# Patient Record
Sex: Male | Born: 1985 | Race: White | Hispanic: No | Marital: Married | Smoking: Current every day smoker
Health system: Southern US, Community
[De-identification: ages and names within clinical notes are randomized; demographics above are authoritative.]

## PROBLEM LIST (undated history)

## (undated) DIAGNOSIS — E669 Obesity, unspecified: Secondary | ICD-10-CM

## (undated) HISTORY — PX: NO PAST SURGERIES: SHX2092

---
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
## 2004-02-01 ENCOUNTER — Emergency Department (HOSPITAL_COMMUNITY): Admission: EM | Admit: 2004-02-01 | Discharge: 2004-02-02 | Payer: Self-pay | Admitting: Emergency Medicine

## 2013-06-13 ENCOUNTER — Inpatient Hospital Stay (HOSPITAL_COMMUNITY)
Admission: EM | Admit: 2013-06-13 | Discharge: 2013-06-16 | DRG: 864 | Disposition: A | Discharge status: Home / Self Care | Payer: Managed Care, Other (non HMO) | Attending: Internal Medicine | Admitting: Internal Medicine

## 2013-06-13 ENCOUNTER — Emergency Department (HOSPITAL_COMMUNITY): Payer: Managed Care, Other (non HMO)

## 2013-06-13 ENCOUNTER — Encounter (HOSPITAL_COMMUNITY): Payer: Self-pay | Admitting: Emergency Medicine

## 2013-06-13 ENCOUNTER — Emergency Department (HOSPITAL_COMMUNITY)
Admission: EM | Admit: 2013-06-13 | Discharge: 2013-06-13 | Disposition: A | Discharge status: Home / Self Care | Payer: Managed Care, Other (non HMO) | Attending: Emergency Medicine | Admitting: Emergency Medicine

## 2013-06-13 DIAGNOSIS — R509 Fever, unspecified: Principal | ICD-10-CM | POA: Diagnosis present

## 2013-06-13 DIAGNOSIS — E669 Obesity, unspecified: Secondary | ICD-10-CM

## 2013-06-13 DIAGNOSIS — R000 Tachycardia, unspecified: Secondary | ICD-10-CM | POA: Diagnosis present

## 2013-06-13 DIAGNOSIS — R21 Rash and other nonspecific skin eruption: Secondary | ICD-10-CM | POA: Insufficient documentation

## 2013-06-13 DIAGNOSIS — B2790 Infectious mononucleosis, unspecified without complication: Secondary | ICD-10-CM | POA: Diagnosis present

## 2013-06-13 DIAGNOSIS — R600 Localized edema: Secondary | ICD-10-CM | POA: Diagnosis present

## 2013-06-13 DIAGNOSIS — M7989 Other specified soft tissue disorders: Secondary | ICD-10-CM | POA: Diagnosis present

## 2013-06-13 DIAGNOSIS — E871 Hypo-osmolality and hyponatremia: Secondary | ICD-10-CM | POA: Diagnosis present

## 2013-06-13 DIAGNOSIS — F17200 Nicotine dependence, unspecified, uncomplicated: Secondary | ICD-10-CM | POA: Diagnosis not present

## 2013-06-13 DIAGNOSIS — Z833 Family history of diabetes mellitus: Secondary | ICD-10-CM

## 2013-06-13 DIAGNOSIS — R609 Edema, unspecified: Secondary | ICD-10-CM | POA: Diagnosis present

## 2013-06-13 DIAGNOSIS — Z6841 Body Mass Index (BMI) 40.0 and over, adult: Secondary | ICD-10-CM

## 2013-06-13 DIAGNOSIS — Z823 Family history of stroke: Secondary | ICD-10-CM

## 2013-06-13 HISTORY — DX: Obesity, unspecified: E66.9

## 2013-06-13 LAB — CBC WITH DIFFERENTIAL/PLATELET
Basophils Absolute: 0 10*3/uL (ref 0.0–0.1)
Basophils Relative: 0 % (ref 0–1)
Basophils Relative: 0 % (ref 0–1)
Eosinophils Relative: 1 % (ref 0–5)
HCT: 42.3 % (ref 39.0–52.0)
HCT: 40.6 % (ref 39.0–52.0)
Hemoglobin: 14.9 g/dL (ref 13.0–17.0)
Lymphocytes Relative: 12 % (ref 12–46)
Lymphocytes Relative: 11 % — ABNORMAL LOW (ref 12–46)
Lymphs Abs: 0.7 10*3/uL (ref 0.7–4.0)
Lymphs Abs: 0.7 10*3/uL (ref 0.7–4.0)
MCHC: 35.2 g/dL (ref 30.0–36.0)
MCHC: 36 g/dL (ref 30.0–36.0)
MCV: 84.9 fL (ref 78.0–100.0)
Monocytes Absolute: 0.6 10*3/uL (ref 0.1–1.0)
Monocytes Absolute: 0.9 10*3/uL (ref 0.1–1.0)
Monocytes Relative: 10 % (ref 3–12)
Neutro Abs: 4.4 10*3/uL (ref 1.7–7.7)
Neutrophils Relative %: 78 % — ABNORMAL HIGH (ref 43–77)
Neutrophils Relative %: 71 % (ref 43–77)
Platelets: 168 10*3/uL (ref 150–400)
Platelets: 162 10*3/uL (ref 150–400)
RBC: 4.97 MIL/uL (ref 4.22–5.81)
RDW: 12.1 % (ref 11.5–15.5)
WBC: 5.7 10*3/uL (ref 4.0–10.5)
WBC: 5.7 10*3/uL (ref 4.0–10.5)

## 2013-06-13 LAB — COMPREHENSIVE METABOLIC PANEL
ALT: 31 U/L (ref 0–53)
AST: 30 U/L (ref 0–37)
Albumin: 3.2 g/dL — ABNORMAL LOW (ref 3.5–5.2)
CO2: 28 mEq/L (ref 19–32)
Calcium: 8.6 mg/dL (ref 8.4–10.5)
Chloride: 99 mEq/L (ref 96–112)
Creatinine, Ser: 0.86 mg/dL (ref 0.50–1.35)
GFR calc non Af Amer: >90 mL/min (ref >90)
Glucose, Bld: 136 mg/dL — ABNORMAL HIGH (ref 70–99)
Sodium: 135 mEq/L (ref 135–145)
Total Protein: 6.6 g/dL (ref 6.0–8.3)

## 2013-06-13 LAB — HEPATIC FUNCTION PANEL
ALT: 34 U/L (ref 0–53)
Alkaline Phosphatase: 68 U/L (ref 39–117)
Indirect Bilirubin: 0.4 mg/dL (ref 0.3–0.9)
Total Bilirubin: 0.5 mg/dL (ref 0.3–1.2)
Total Protein: 7 g/dL (ref 6.0–8.3)

## 2013-06-13 LAB — BASIC METABOLIC PANEL
BUN: 12 mg/dL (ref 6–23)
CO2: 25 mEq/L (ref 19–32)
Chloride: 95 mEq/L — ABNORMAL LOW (ref 96–112)
GFR calc Af Amer: >90 mL/min (ref >90)
Glucose, Bld: 116 mg/dL — ABNORMAL HIGH (ref 70–99)
Potassium: 3.4 mEq/L — ABNORMAL LOW (ref 3.5–5.1)
Sodium: 131 mEq/L — ABNORMAL LOW (ref 135–145)

## 2013-06-13 LAB — SEDIMENTATION RATE: Sed Rate: 45 mm/hr — ABNORMAL HIGH (ref 0–16)

## 2013-06-13 LAB — D-DIMER, QUANTITATIVE (NOT AT ARMC): D-Dimer, Quant: 2.79 ug/mL-FEU — ABNORMAL HIGH (ref 0.00–0.48)

## 2013-06-13 MED ORDER — HYDROCODONE-ACETAMINOPHEN 5-325 MG PO TABS: ORAL_TABLET | ORAL | Status: DC

## 2013-06-13 MED ORDER — HYDROMORPHONE HCL PF 1 MG/ML IJ SOLN
0.5000 mg | INTRAMUSCULAR | Status: DC | PRN
  Administered 2013-06-14: 0.5 mg via INTRAVENOUS
  Filled 2013-06-13: qty 1

## 2013-06-13 MED ORDER — DOXYCYCLINE HYCLATE 100 MG PO TABS
100.0000 mg | ORAL_TABLET | Freq: Two times a day (BID) | ORAL | Status: DC
  Administered 2013-06-13 – 2013-06-16 (×6): 100 mg via ORAL
  Filled 2013-06-13 (×7): qty 1

## 2013-06-13 MED ORDER — ACETAMINOPHEN 650 MG RE SUPP: 650.0000 mg | Freq: Four times a day (QID) | RECTAL | Status: DC | PRN

## 2013-06-13 MED ORDER — IBUPROFEN 800 MG PO TABS
800.0000 mg | ORAL_TABLET | Freq: Once | ORAL | Status: AC
  Administered 2013-06-13: 800 mg via ORAL
  Filled 2013-06-13: qty 1

## 2013-06-13 MED ORDER — ACETAMINOPHEN 325 MG PO TABS: 650.0000 mg | ORAL_TABLET | Freq: Four times a day (QID) | ORAL | Status: DC | PRN

## 2013-06-13 MED ORDER — DOXYCYCLINE HYCLATE 100 MG PO TABS
100.0000 mg | ORAL_TABLET | Freq: Once | ORAL | Status: AC
  Administered 2013-06-13: 100 mg via ORAL
  Filled 2013-06-13: qty 1

## 2013-06-13 MED ORDER — ONDANSETRON HCL 4 MG PO TABS
4.0000 mg | ORAL_TABLET | Freq: Once | ORAL | Status: AC
  Administered 2013-06-13: 4 mg via ORAL
  Filled 2013-06-13: qty 1

## 2013-06-13 MED ORDER — ENOXAPARIN SODIUM 80 MG/0.8ML ~~LOC~~ SOLN
70.0000 mg | Freq: Every day | SUBCUTANEOUS | Status: DC
  Administered 2013-06-13 – 2013-06-15 (×3): 70 mg via SUBCUTANEOUS
  Filled 2013-06-13 (×4): qty 0.8

## 2013-06-13 MED ORDER — ONDANSETRON HCL 4 MG PO TABS: 4.0000 mg | ORAL_TABLET | Freq: Four times a day (QID) | ORAL | Status: DC | PRN

## 2013-06-13 MED ORDER — SODIUM CHLORIDE 0.9 % IV SOLN
INTRAVENOUS | Status: DC
  Administered 2013-06-13 – 2013-06-15 (×4): via INTRAVENOUS

## 2013-06-13 MED ORDER — SODIUM CHLORIDE 0.9 % IV SOLN
1000.0000 mL | INTRAVENOUS | Status: DC
  Administered 2013-06-13: 1000 mL via INTRAVENOUS

## 2013-06-13 MED ORDER — ALUM & MAG HYDROXIDE-SIMETH 200-200-20 MG/5ML PO SUSP: 30.0000 mL | Freq: Four times a day (QID) | ORAL | Status: DC | PRN

## 2013-06-13 MED ORDER — ONDANSETRON HCL 4 MG/2ML IJ SOLN: 4.0000 mg | Freq: Four times a day (QID) | INTRAMUSCULAR | Status: DC | PRN

## 2013-06-13 MED ORDER — SODIUM CHLORIDE 0.9 % IV BOLUS (SEPSIS)
1000.0000 mL | Freq: Once | INTRAVENOUS | Status: AC
  Administered 2013-06-13: 1000 mL via INTRAVENOUS

## 2013-06-13 MED ORDER — HYDROCODONE-ACETAMINOPHEN 5-325 MG PO TABS
2.0000 | ORAL_TABLET | Freq: Once | ORAL | Status: AC
  Administered 2013-06-13: 2 via ORAL
  Filled 2013-06-13: qty 2

## 2013-06-13 MED ORDER — SODIUM CHLORIDE 0.9 % IV SOLN: INTRAVENOUS | Status: AC

## 2013-06-13 MED ORDER — DOXYCYCLINE HYCLATE 100 MG PO CAPS: 100.0000 mg | ORAL_CAPSULE | Freq: Two times a day (BID) | ORAL | Status: DC

## 2013-06-13 MED ORDER — OXYCODONE HCL 5 MG PO TABS: 5.0000 mg | ORAL_TABLET | ORAL | Status: DC | PRN

## 2013-06-13 MED ORDER — ZOLPIDEM TARTRATE 5 MG PO TABS
5.0000 mg | ORAL_TABLET | Freq: Every evening | ORAL | Status: DC | PRN
  Administered 2013-06-13: 5 mg via ORAL
  Filled 2013-06-13: qty 1

## 2013-06-13 MED ORDER — SODIUM CHLORIDE 0.9 % IV SOLN
1000.0000 mL | Freq: Once | INTRAVENOUS | Status: AC
  Administered 2013-06-13: 1000 mL via INTRAVENOUS

## 2013-06-13 NOTE — H&P (Signed)
[facility] History and Physical  Craig Stout ZOX:[id number] DOB: 10-25-1985 DOA: 06/13/2013  Referring physician:  EDP PCP: No PCP Per Patient  Specialists:   Chief Complaint:   Fever and Worsening Rash  HPI: Craig Stout is a 27 y.o. male who presents to the [facility] ED with complaints of Fevers and Worsening of a Rash X 2 days and Edema of both of his legs for the past 3 days.    The Rash erupted on his lower legs and has progressed up to his ABD.   The rash is non-pruritic, and painless.   He was seen at the [facility] ED in the AM and was evaluated  And had blood cultures done, and an RPR, and Lyme disease titer were sent, and he was placed empirically on doxycycline.   He came to [facility] ED later because the rash continued to spread.     Of note, he works as a Designer, jewellery distances, and he has a sick 68 year old child at home with a URI Illness.   He does report having some URI sxs such as cough and congestion.   He also has a dog at home, and does not recall having any tick bites.     Review of Systems: The patient denies anorexia, headaches, weight loss, vision loss, diplopia, dizziness, decreased hearing, rhinitis, hoarseness, chest pain, syncope, dyspnea on exertion, balance deficits, cough, hemoptysis, abdominal pain, nausea, vomiting, diarrhea, constipation, hematemesis, melena, hematochezia, severe indigestion/heartburn, dysuria, hematuria, incontinence, muscle weakness, suspicious skin lesions, transient blindness, difficulty walking, depression, unusual weight change, abnormal bleeding, enlarged lymph nodes, angioedema, and breast masses.    Past Medical History  Diagnosis Date  . Obesity     Past Surgical History  Procedure Laterality Date  . No past surgeries      Prior to Admission medications   Medication Sig Start Date End Date Taking? Authorizing Provider  doxycycline (VIBRAMYCIN) 100 MG capsule Take 1 capsule (100 mg total) by mouth  2 (two) times daily. 06/13/13 06/23/13 Yes Kathie Dike, PA-C  HYDROcodone-acetaminophen (NORCO/VICODIN) 5-325 MG per tablet Take 1-2 tablets by mouth every 6 (six) hours as needed for moderate pain.   Yes Historical Provider, MD  ibuprofen (ADVIL,MOTRIN) 200 MG tablet Take 600 mg by mouth every 6 (six) hours as needed for headache or moderate pain.   Yes Historical Provider, MD  MELATONIN PO Take 2 tablets by mouth as needed (sleep).   Yes Historical Provider, MD    No Known Allergies  Social History:  reports that he has been smoking Cigarettes.  He has been smoking about 0.00 packs per day. He does not have any smokeless tobacco history on file. He reports that he drinks alcohol. His drug history is not on file.     Family History  Problem Relation Age of Onset  . Stroke Maternal Grandmother   . Diabetes Father       Physical Exam:  GEN:  Pleasant  MorbidlyObese 27 y.o. Caucasian male examined  and in no acute distress; cooperative with exam Filed Vitals:   06/13/13 1813 06/13/13 1819 06/13/13 2045  BP: 122/62 115/55 112/58  Pulse: 89 83 80  Temp: 97.8 F (36.6 C) 97.4 F (36.3 C)   TempSrc: Oral Oral   Resp: 16 18   SpO2: 98% 95% 99%   Blood pressure 112/58, pulse 80, temperature 97.4 F (36.3 C), temperature source Oral, resp. rate 18, SpO2 99.00%. PSYCH: He is  alert and oriented x4; does not appear anxious does not appear depressed; affect is normal HEENT: Normocephalic and Atraumatic, Mucous membranes pink; Mild Conjuctival Injection; PERRLA; EOM intact; Fundi:  Benign;  No scleral icterus, Nares: Patent, Oropharynx: Clear, Fair Dentition, Neck:  FROM, no cervical lymphadenopathy nor thyromegaly or carotid bruit; no JVD; Breasts:: Not examined CHEST WALL: No tenderness CHEST: Normal respiration, clear to auscultation bilaterally HEART: Regular rate and rhythm; no murmurs rubs or gallops BACK: No kyphosis or scoliosis; no CVA tenderness ABDOMEN: Positive Bowel Sounds,  Obese, soft non-tender; no masses, no organomegaly, no pannus; no intertriginous candida. Rectal Exam: Not done EXTREMITIES: No cyanosis, clubbing or edema; no ulcerations. Genitalia: not examined PULSES: 2+ and symmetric SKIN: Normal hydration;   +  Maculopapular Lesions on BLEs with progression to the lower Abdominal Area, Non-blanching, without exudate or ulceration CNS: Cranial nerves 2-12 grossly intact no focal neurologic deficit    Labs on Admission:  Basic Metabolic Panel:  Recent Labs Lab 06/13/13 1232 06/13/13 1907  NA 131* 135  K 3.4* 3.8  CL 95* 99  CO2 25 28  GLUCOSE 116* 136*  BUN 12 14  CREATININE 0.88 0.86  CALCIUM 9.0 8.6   Liver Function Tests:  Recent Labs Lab 06/13/13 1232 06/13/13 1907  AST 34 30  ALT 34 31  ALKPHOS 68 66  BILITOT 0.5 0.5  PROT 7.0 6.6  ALBUMIN 3.5 3.2*   No results found for this basename: LIPASE, AMYLASE,  in the last 168 hours No results found for this basename: AMMONIA,  in the last 168 hours CBC:  Recent Labs Lab 06/13/13 1232 06/13/13 1907  WBC 5.7 5.7  NEUTROABS 4.4 4.1  HGB 14.9 14.6  HCT 42.3 40.6  MCV 85.1 84.9  PLT 168 162   Cardiac Enzymes: No results found for this basename: CKTOTAL, CKMB, CKMBINDEX, TROPONINI,  in the last 168 hours  BNP (last 3 results) No results found for this basename: PROBNP,  in the last 8760 hours CBG: No results found for this basename: GLUCAP,  in the last 168 hours  Radiological Exams on Admission: Dg Chest Port 1 View  06/13/2013   CLINICAL DATA:  Chest pain.  EXAM: PORTABLE CHEST - 1 VIEW  COMPARISON:  None.  FINDINGS: The heart size and mediastinal contours are within normal limits. Both lungs are clear. The visualized skeletal structures are unremarkable.  IMPRESSION: No active disease.   Electronically Signed   By: Elige Ko   On: 06/13/2013 12:28        Assessment/Plan Principal Problem:   Febrile illness Active Problems:   Cutaneous eruption   Edema of  both legs   Obesity   Tobacco use disorder    1.  Febrile Illness-  Most Likely viral,  Blood Cultures had been sent, and are pending.  Differential Dx includes:  Viral Exanthem, such as Parvo, Bacterial Endocarditis less likely, or a Rheumatological Exanthem such as Still's disease.  Monitor PLTs and WBCs,     2.  Cutaneous Eruption- progressing,  See Diff Dx in #1.  Monitor, most likely Self Limited.    3.  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     4.   Obesity-  Needs Weight loss and increase in Activity.     5.   Tobacco Use Disorder-  Cessation discussed, Nicotine patch while Inpt.    6.  DVT prophylaxis  with Lovenox, but monitor PLTs.          Code Status:   FULL CODE Family Communication:    Family at bedside Disposition Plan:     Observation     Time spent: 37 Minutes  Ron Parker [facility] Pager [phone]  If 7PM-7AM, please contact night-coverage [email or web address] Password [facility] 06/13/2013, 9:21 PM

## 2013-06-13 NOTE — ED Notes (Signed)
He states he has a gradually expanding rash which began at lat. Left ankle this Tues.  Seen at [facility] this morning; and was rx with Doxycycline.  He is concerned that rash which was this morning limited to bilat. Lower extremities now involves lower abd.  He also c/o swelling of bilat. Lower legs/ankles.  He currently has 2-3+ pitting edema of bilat. ankles/feet.

## 2013-06-13 NOTE — Progress Notes (Signed)
PHARMACY - LOVENOX  Lovenox 40mg  sq q24h ordered for VTE prophylaxis in this 27 yr male.  Pharmacy asked to adjust dose as needed.  TBW = 136.1kg with BMI = 40 and CrCl > 120 ml/min.  As BMI > 30, will adjust Lovenox dose to 0.5 mg/kg/q24h  PLAN:  Change Lovenox to 70mg  sq q24h  Thank you, Terrilee Files, PharmD

## 2013-06-13 NOTE — ED Provider Notes (Signed)
Medical screening examination/treatment/procedure(s) were conducted as a shared visit with non-physician practitioner(s) and myself.  I personally evaluated the patient during the encounter.  EKG Interpretation   None       Patient is a 27 y.o. male with no significant past medical history who has had redness, swelling, pain and a rash to his bilateral lower extremities as well as fever that started 3 days ago. On exam, patient was initially febrile and tachycardic but this has resolved with treatment of his fever. He's otherwise well-appearing, nontoxic, well-hydrated. Denies any headache or neck pain next is Ms. No meningismus on exam. Neurologically intact. Patient does have a erythematous, papular rash to his bilateral lower extremities it is worse at his medial thighs. Very mild swelling of his lower extremities bilaterally. Normal sensation and 2+ DP pulses bilaterally. No drainage from any of these lesions. No induration. Very minimal petechiae seen on the anterior right distal shin. Suspect folliculitis versus possible tick borne illness. Given patient is well-appearing, I feel he is safe for discharge home. Have offered admission however the patient would like discharge. His labs do show a mild hyponatremia and thrombocytopenia. No transaminitis. Chest x-ray is clear. No leukocytosis. We'll discharge him on doxycycline to cover for tick borne illness as well as folliculitis. Given strict return precautions. Patient and wife at bedside verbalize understanding and are comfortable with plan.  Layla Maw Ward, DO 06/13/13 1630

## 2013-06-13 NOTE — ED Notes (Signed)
Bilateral redness and swelling to lower extremities. Symptoms began 3 days ago.

## 2013-06-13 NOTE — ED Provider Notes (Signed)
CSN: [id number]     Arrival date & time 06/13/13  1758 History   First MD Initiated Contact with Patient 06/13/13 1802     Chief Complaint  Patient presents with  . Rash  . Leg Swelling   (Consider location/radiation/quality/duration/timing/severity/associated sxs/prior Treatment) HPI Craig Stout is a 27 y.o. male  presents to emergency department with complaint of lower extremity swelling and rash. Patient states that he developed fevers, rash over his legs, leg swelling yesterday. States fever up to 101 yesterday. States taking ibuprofen for the fever which controls it. Patient states that he went to an emergency department this morning where he was evaluated and thought to have either folliculitis versus tickborne illness, he was started on doxycycline. At that time he was offered admission but she declined. He was found to have a fever of 101.4, tachycardia. Improved with fluids. The patient was discharged home just several hours ago, and states rash is now spreading to the abdomen. He continued take ibuprofen which is controlling his fever. Patient's significant other states that she works as a Armed forces operational officer and she sent a picture and orbits are MRSA and he told her she needs to come to the ER to get admitted.  History reviewed. No pertinent past medical history. History reviewed. No pertinent past surgical history. No family history on file. History  Substance Use Topics  . Smoking status: Current Every Day Smoker    Types: Cigarettes  . Smokeless tobacco: Not on file  . Alcohol Use: Yes     Comment: rarely    Review of Systems  Constitutional: Negative for fever and chills.  HENT: Negative for mouth sores, sneezing and sore throat.   Respiratory: Negative for cough, chest tightness and shortness of breath.   Cardiovascular: Positive for leg swelling. Negative for chest pain and palpitations.  Gastrointestinal: Negative for nausea, vomiting, abdominal pain, diarrhea and  abdominal distention.  Genitourinary: Negative for dysuria, urgency, frequency and hematuria.  Musculoskeletal: Negative for arthralgias, myalgias, neck pain and neck stiffness.  Skin: Positive for color change and rash.  Allergic/Immunologic: Negative for immunocompromised state.  Neurological: Negative for dizziness, weakness, light-headedness, numbness and headaches.    Allergies  Review of patient's allergies indicates no known allergies.  Home Medications   Current Outpatient Rx  Name  Route  Sig  Dispense  Refill  . HYDROcodone-acetaminophen (NORCO/VICODIN) 5-325 MG per tablet   Oral   Take 1-2 tablets by mouth every 6 (six) hours as needed for moderate pain.         Marland Kitchen ibuprofen (ADVIL,MOTRIN) 200 MG tablet   Oral   Take 600 mg by mouth every 6 (six) hours as needed for headache or moderate pain.         Marland Kitchen MELATONIN PO   Oral   Take 2 tablets by mouth as needed (sleep).         Marland Kitchen doxycycline (VIBRAMYCIN) 100 MG capsule   Oral   Take 1 capsule (100 mg total) by mouth 2 (two) times daily.   14 capsule   0    BP 115/55  Pulse 83  Temp(Src) 97.4 F (36.3 C) (Oral)  Resp 18  SpO2 95% Physical Exam  Nursing note and vitals reviewed. Constitutional: He appears well-developed and well-nourished. No distress.  HENT:  Head: Normocephalic and atraumatic.  Eyes: Conjunctivae are normal.  Neck: Neck supple.  Cardiovascular: Normal rate, regular rhythm and normal heart sounds.   Pulmonary/Chest: Effort normal. No respiratory distress. He has no wheezes.  He has no rales.  Abdominal: Soft. Bowel sounds are normal. He exhibits no distension. There is no tenderness. There is no rebound.  Musculoskeletal: He exhibits no edema.  Neurological: He is alert.  Skin: Skin is warm and dry. Rash noted.  Soles of the feet are erythematous, nontender. There is diffuse patchy erythematous rash over her lower extremities and lower abdomen. There several flat lesions as well as  raised papules. There several petechial lesions as well. No drainage.    ED Course  Procedures (including critical care time) Labs Review Labs Reviewed  CBC WITH DIFFERENTIAL - Abnormal; Notable for the following:    Lymphocytes Relative 11 (*)    Monocytes Relative 16 (*)    All other components within normal limits  COMPREHENSIVE METABOLIC PANEL - Abnormal; Notable for the following:    Glucose, Bld 136 (*)    Albumin 3.2 (*)    All other components within normal limits  D-DIMER, QUANTITATIVE - Abnormal; Notable for the following:    D-Dimer, Quant 2.79 (*)    All other components within normal limits  ROCKY MTN SPOTTED FVR AB, IGG-BLOOD  ROCKY MTN SPOTTED FVR AB, IGM-BLOOD  LYME DISEASE DNA BY PCR(BORRELIA BURG)   Imaging Review Dg Chest Port 1 View  06/13/2013   CLINICAL DATA:  Chest pain.  EXAM: PORTABLE CHEST - 1 VIEW  COMPARISON:  None.  FINDINGS: The heart size and mediastinal contours are within normal limits. Both lungs are clear. The visualized skeletal structures are unremarkable.  IMPRESSION: No active disease.   Electronically Signed   By: Elige Ko   On: 06/13/2013 12:28    EKG Interpretation   None       MDM   1. Febrile illness   2. Cutaneous eruption   3. Edema of both legs   4. Obesity   5. Tobacco use disorder     Patient bilateral lower leg rash, fever, bilateral ankle swelling and pain. He was febrile this morning when was seen in any pain in emergency department with temperature 101.4. Here he is afebrile but he just took ibuprofen prior to coming in. He is nontoxic appearing. He denies any upper respiratory symptoms. He denies any headache, neck pain or stiffness. There is no meningismus on his exam. Given his progressing rash and fever as well as increasing joint pain and swelling will admit for office. I did speak with [facility] and they will come down and see him. He was given his next dose of doxycycline in emergency department. Rocky  mountain spotted fever and Lyme disease titers ordered. Blood cultures were ordered this morning. RPR is back nonreactive.   Lottie Mussel, PA-C 06/14/13 0036

## 2013-06-13 NOTE — ED Notes (Signed)
Called [facility]W to give report on pt, stated that they were unaware pt was coming to the floor and there was not a nurse available, would return call.

## 2013-06-13 NOTE — ED Notes (Signed)
Pt states he has bilateral lower extremity swelling and rash to both legs. Symptoms started 3 days ago. Pt went to [facility] this morning and was given Vibramycin. Pt states rash is getting worse and now moved up to his abdomen. Pt states he went out of the country on a cruise about a month ago. Pt denies other symptoms. Pt denies itching from rash. Denies pain. Pt ambulatory with steady gait.

## 2013-06-13 NOTE — ED Notes (Signed)
Hospitalist at bedside at this time 

## 2013-06-13 NOTE — ED Provider Notes (Signed)
CSN: [id number]     Arrival date & time 06/13/13  1011 History   First MD Initiated Contact with Patient 06/13/13 1049     Chief Complaint  Patient presents with  . Leg Swelling   (Consider location/radiation/quality/duration/timing/severity/associated sxs/prior Treatment) HPI Comments: Pt is a 27 y/o male who presents to ED with c/o swelling and redness of the lower legs. This started about 3 days ago. No hx of injury or accident. No previous hx of redness or swelling. No recent medications changes. Pt c/o chills and fever. No recent injury or accident to the lower legs. No recent scratches or scrapes. No hx of diabetes. No hx of vascular problem.  Pt has a dog, but is not sure of recent tic bites. 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 He is not sure of bed bug bites. He has a small child at home with URI, but otherwise no known sick contact.   The history is provided by the patient.    History reviewed. No pertinent past medical history. History reviewed. No pertinent past surgical history. No family history on file. History  Substance Use Topics  . Smoking status: Current Every Day Smoker    Types: Cigarettes  . Smokeless tobacco: Not on file  . Alcohol Use: Yes     Comment: rarely    Review of Systems  Constitutional: Positive for fever and chills. Negative for activity change.       All ROS Neg except as noted in HPI  HENT: Negative for nosebleeds.   Eyes: Negative for photophobia and discharge.  Respiratory: Negative for cough, shortness of breath and wheezing.   Cardiovascular: Negative for chest pain and palpitations.  Gastrointestinal: Negative for abdominal pain and blood in stool.  Genitourinary: Negative for dysuria, frequency and hematuria.  Musculoskeletal: Negative for arthralgias, back pain and neck pain.  Skin: Positive for rash.  Neurological: Negative for dizziness, seizures and speech difficulty.  Psychiatric/Behavioral:  Negative for hallucinations and confusion.    Allergies  Review of patient's allergies indicates no known allergies.  Home Medications   Current Outpatient Rx  Name  Route  Sig  Dispense  Refill  . ibuprofen (ADVIL,MOTRIN) 200 MG tablet   Oral   Take 600 mg by mouth every 6 (six) hours as needed for headache or moderate pain.         Marland Kitchen MELATONIN PO   Oral   Take 2 tablets by mouth as needed (sleep).          BP 130/62  Pulse 110  Temp(Src) 101.4 F (38.6 C) (Oral)  Resp 18  Ht 6' (1.829 m)  Wt 300 lb (136.079 kg)  BMI 40.68 kg/m2  SpO2 99% Physical Exam  Nursing note and vitals reviewed. Constitutional: He is oriented to person, place, and time. He appears well-developed and well-nourished.  Non-toxic appearance.  HENT:  Head: Normocephalic.  Right Ear: Tympanic membrane and external ear normal.  Left Ear: Tympanic membrane and external ear normal.  Pt speaks in complete sentences.  Eyes: EOM and lids are normal. Pupils are equal, round, and reactive to light.  Neck: Normal range of motion. Neck supple. Carotid bruit is not present.  Cardiovascular: Regular rhythm, normal heart sounds, intact distal pulses and normal pulses.  Tachycardia present.   Pulmonary/Chest: Breath sounds normal. No respiratory distress. He has no decreased breath sounds. He has no wheezes. He has no rhonchi. He has no rales.  Abdominal: Soft.  Bowel sounds are normal. There is no tenderness. There is no guarding.  Few macular papular areas of rash with redness, no blanching on the lower abd.  Musculoskeletal: Normal range of motion.  Neg Homan's sign. No asymetry of the lower legs.  Lymphadenopathy:       Head (right side): No submandibular adenopathy present.       Head (left side): No submandibular adenopathy present.    He has no cervical adenopathy.  Neurological: He is alert and oriented to person, place, and time. He has normal strength. No cranial nerve deficit or sensory deficit.   Skin: Skin is warm and dry.  Pt hot to warm all over.  Maculopapular rash with redness of both lower extremities. No ulcers, no drainage,  Psychiatric: He has a normal mood and affect. His speech is normal.    ED Course  Procedures (including critical care time) Labs Review Labs Reviewed - No data to display Imaging Review No results found.  EKG Interpretation   None       MDM  No diagnosis found. **I have reviewed nursing notes, vital signs, and all appropriate lab and imaging results for this patient.* Lactic acid wnl 1.0. CBC wnl. Bmet sodium 131 low, K+ 3.4 low, o/w wnl. Hepatic function wnl. Chest xray wnl. Pt seen with me by Dr Elesa Massed.  Temp elevation responsive to ibuprofen. Pain improved with norco. Heart rate much improved at d/c.  Pt has dog, and works as a Naval architect. Rash has a fascicular type appearance, temp responsive to ibuprofen, Lactic acid wnl. No decrease in b/p while in ED.  Plan - will treat with doxycycline, increase fluids, and use ibuprofen every 6 hours. Pt to return if not improving or change in condition.  At discharge, pt's wife reported that the pathologist she works with  Suggested to her by phone that the pt should have a dermatology consult emergently. I explained to the patient that the [facility] did not have a dermatologist on call, and I would see if the [facility] had a derm consult on call. While I was researching this, the wife said she was going to leave and get the Rx filled and try to see a dermatologist.  Kathie Dike, PA-C 06/15/13 1237

## 2013-06-14 DIAGNOSIS — R609 Edema, unspecified: Secondary | ICD-10-CM

## 2013-06-14 LAB — HEPATIC FUNCTION PANEL
ALT: 26 U/L (ref 0–53)
Albumin: 3 g/dL — ABNORMAL LOW (ref 3.5–5.2)
Alkaline Phosphatase: 60 U/L (ref 39–117)
Bilirubin, Direct: 0.1 mg/dL (ref 0.0–0.3)
Indirect Bilirubin: 0.4 mg/dL (ref 0.3–0.9)
Total Bilirubin: 0.5 mg/dL (ref 0.3–1.2)
Total Protein: 6 g/dL (ref 6.0–8.3)

## 2013-06-14 LAB — TSH: TSH: 0.586 u[IU]/mL (ref 0.350–4.500)

## 2013-06-14 LAB — BASIC METABOLIC PANEL
CO2: 20 mEq/L (ref 19–32)
Calcium: 8.5 mg/dL (ref 8.4–10.5)
Chloride: 99 mEq/L (ref 96–112)
GFR calc Af Amer: >90 mL/min (ref >90)
Sodium: 132 mEq/L — ABNORMAL LOW (ref 135–145)

## 2013-06-14 LAB — URINALYSIS, ROUTINE W REFLEX MICROSCOPIC
Bilirubin Urine: NEGATIVE
Glucose, UA: NEGATIVE mg/dL
Hgb urine dipstick: NEGATIVE
Protein, ur: NEGATIVE mg/dL
Urobilinogen, UA: 0.2 mg/dL (ref 0.0–1.0)

## 2013-06-14 LAB — CBC
MCV: 85.1 fL (ref 78.0–100.0)
Platelets: 174 10*3/uL (ref 150–400)
RBC: 4.5 MIL/uL (ref 4.22–5.81)
WBC: 5 10*3/uL (ref 4.0–10.5)

## 2013-06-14 LAB — RAPID URINE DRUG SCREEN, HOSP PERFORMED
Benzodiazepines: NOT DETECTED
Cocaine: NOT DETECTED
Opiates: NOT DETECTED
Tetrahydrocannabinol: NOT DETECTED

## 2013-06-14 LAB — HIV ANTIBODY (ROUTINE TESTING W REFLEX): HIV: NONREACTIVE

## 2013-06-14 MED ORDER — PNEUMOCOCCAL VAC POLYVALENT 25 MCG/0.5ML IJ INJ
0.5000 mL | INJECTION | INTRAMUSCULAR | Status: AC
  Administered 2013-06-15: 0.5 mL via INTRAMUSCULAR
  Filled 2013-06-14 (×2): qty 0.5

## 2013-06-14 NOTE — ED Provider Notes (Signed)
Medical screening examination/treatment/procedure(s) were conducted as a shared visit with non-physician practitioner(s) and myself.  I personally evaluated the patient during the encounter.  EKG Interpretation     Ventricular Rate:    PR Interval:    QRS Duration:   QT Interval:    QTC Calculation:   R Axis:     Text Interpretation:              Patient with advancing rash with fevers. No CNS or head sx. Differential includes parvovirus, RMSF, disseminated gonorrhea. Will continue abx and monitor in hospital.   Audree Camel, MD 06/14/13 1059

## 2013-06-14 NOTE — Progress Notes (Signed)
Patient ID: Craig Stout, male   DOB: 11/02/1985, 27 y.o.   MRN: [id number] [facility] PROGRESS NOTE  Craig Stout:[id number] DOB: 05-30-1986 DOA: 06/13/2013 PCP: No PCP Per Patient  Brief narrative: 27 y.o. male who presented to [facility] ED with persistent fevers and worsening rash that initially started several days prior to this admission and associated with bilateral lower extremity edema. Pt explains that rash initially started in lower extremities and has now progressed to abdominal area, its non pruritic and is painless.  He was seen in [facility] ED earlier prior to this admission and was started on Doxycycline. His rash continued to progress and pt came to  [facility]. Pt has 3 yo child at home with URI illness with cough and congestions.   Principal Problem:   Febrile illness - unclear etiology - will place order for HIV, ANA, viral panel, hepatitis panel  - tests for Lyme disease and RMSF still pending, blood culture pending   - continue doxycycline day #2 but if fever persists will consider further work up with CT abd/pelvis/chest - consider ID consult in AM if the above tests unremarkable Active Problems:   Cutaneous eruption - unclear etiology and if related to principal problem - ANA ordered, other tests as noted above    Edema of both legs - unclear etiology, will check UA to make sure no proteinuria  - Venous doppler pending    Obesity - regular diet    Tobacco use disorder - cessation discussed in detail  Consultants:  None Procedures/Studies: Dg Chest Port 1 View   06/13/2013   No active disease.    Antibiotics:  Doxycycline 11/08 -->  Code Status: Full Family Communication: Pt at bedside Disposition Plan: Home when medically stable  HPI/Subjective: No events overnight.   Objective: Filed Vitals:   06/13/13 2203 06/13/13 2204 06/14/13 0200 06/14/13 0545  BP: 121/64  116/68 109/66  Pulse: 81  89 94  Temp: 97.8 F (36.6 C)  98.8 F (37.1 C) 98.8 F  (37.1 C)  TempSrc: Oral  Oral Oral  Resp: 20  18 18   Height:  6' (1.829 m)    Weight:  136.079 kg (300 lb)    SpO2: 99%  99% 98%    Intake/Output Summary (Last 24 hours) at 06/14/13 0637 Last data filed at 06/14/13 0545  Gross per 24 hour  Intake      0 ml  Output    850 ml  Net   -850 ml    Exam:   General:  Pt is alert, follows commands appropriately, not in acute distress  Cardiovascular: Regular rate and rhythm, S1/S2, no murmurs, no rubs, no gallops  Respiratory: Clear to auscultation bilaterally, no wheezing, no crackles, no rhonchi  Abdomen: Soft, non tender, non distended, bowel sounds present, no guarding  Extremities: Bilateral LE +2 pitting edema, pulses DP and PT palpable bilaterally  Skin: Macular rash scattered in lower extremities bilaterally from ankles to lower abdominal area, no areas of blisters and bleeding   Data Reviewed: Basic Metabolic Panel:  Recent Labs Lab 06/13/13 1232 06/13/13 1907 06/14/13 0422  NA 131* 135 132*  K 3.4* 3.8 3.7  CL 95* 99 99  CO2 25 28 20   GLUCOSE 116* 136* 91  BUN 12 14 13   CREATININE 0.88 0.86 0.84  CALCIUM 9.0 8.6 8.5   Liver Function Tests:  Recent Labs Lab 06/13/13 1232 06/13/13 1907  AST 34 30  ALT 34 31  ALKPHOS 68 66  BILITOT 0.5 0.5  PROT 7.0 6.6  ALBUMIN 3.5 3.2*   CBC:  Recent Labs Lab 06/13/13 1232 06/13/13 1907 06/14/13 0422  WBC 5.7 5.7 5.0  NEUTROABS 4.4 4.1  --   HGB 14.9 14.6 13.4  HCT 42.3 40.6 38.3*  MCV 85.1 84.9 85.1  PLT 168 162 174    Recent Results (from the past 240 hour(s))  CULTURE, BLOOD (ROUTINE X 2)     Status: None   Collection Time    06/13/13 12:32 PM      Result Value Range Status   Specimen Description BLOOD LEFT ARM   Final   Special Requests     Final   Value: BOTTLES DRAWN AEROBIC AND ANAEROBIC 8CC EACH BOTTLE   Culture NO GROWTH 1 DAY   Final   Report Status PENDING   Incomplete  CULTURE, BLOOD (ROUTINE X 2)     Status: None   Collection Time     06/13/13 12:33 PM      Result Value Range Status   Specimen Description BLOOD LEFT HAND   Final   Special Requests     Final   Value: BOTTLES DRAWN AEROBIC AND ANAEROBIC 10CC EACH BOTTLE   Culture NO GROWTH 1 DAY   Final   Report Status PENDING   Incomplete     Scheduled Meds: . sodium chloride   Intravenous STAT  . doxycycline  100 mg Oral BID  . enoxaparin (LOVENOX) injection  70 mg Subcutaneous QHS   Continuous Infusions: . sodium chloride 100 mL/hr at 06/13/13 2205     Craig Presto, MD  [facility] Pager [phone]  If 7PM-7AM, please contact night-coverage [email or web address] Password [email or web address] 06/14/2013, 6:37 AM   LOS: 1 day

## 2013-06-14 NOTE — Progress Notes (Signed)
Bilateral lower extremity venous duplex:  No evidence of DVT, superficial thrombosis, or Baker's Cyst.   

## 2013-06-15 DIAGNOSIS — E871 Hypo-osmolality and hyponatremia: Secondary | ICD-10-CM | POA: Diagnosis present

## 2013-06-15 LAB — CBC
Hemoglobin: 12.7 g/dL — ABNORMAL LOW (ref 13.0–17.0)
MCHC: 34.4 g/dL (ref 30.0–36.0)
Platelets: 185 10*3/uL (ref 150–400)
RBC: 4.31 MIL/uL (ref 4.22–5.81)

## 2013-06-15 LAB — COMPREHENSIVE METABOLIC PANEL
Alkaline Phosphatase: 58 U/L (ref 39–117)
CO2: 26 mEq/L (ref 19–32)
Calcium: 8.6 mg/dL (ref 8.4–10.5)
Creatinine, Ser: 0.78 mg/dL (ref 0.50–1.35)
GFR calc Af Amer: >90 mL/min (ref >90)
GFR calc non Af Amer: >90 mL/min (ref >90)
Glucose, Bld: 90 mg/dL (ref 70–99)
Total Protein: 6 g/dL (ref 6.0–8.3)

## 2013-06-15 LAB — B. BURGDORFI ANTIBODIES: B burgdorferi Ab IgG+IgM: 0.56 {ISR}

## 2013-06-15 LAB — ROCKY MTN SPOTTED FVR AB, IGG-BLOOD: RMSF IgG: 0.18 IV

## 2013-06-15 LAB — ROCKY MTN SPOTTED FVR AB, IGM-BLOOD: RMSF IgM: 0.37 IV (ref 0.00–0.89)

## 2013-06-15 NOTE — Progress Notes (Signed)
[facility] PROGRESS NOTE  Craig Stout:[id number] DOB: 04-04-1986 DOA: 06/13/2013 PCP: No PCP Per Patient  Assessment/Plan: Febrile illness  - unclear etiology; Last Fever 101.4 on 11/8   - will place order for HIV, ANA, viral panel, hepatitis panel  - Lyme disease(negative) -RMSF(ordered 11/10) - Ehrlichia(ordered 11/10) -HIV(negative) -ANA (negative) -Viral Panel Hepatitis Panel;   -ESR = 45 (HIGH) -Parvo Virus (Pending) -CMV Pending -RSV Pending -EBV Pending - continue doxycycline ; rash and pedal edema resolving    Cutaneous eruption  - unclear etiology and if related to principal problem  - ANA ordered, other tests as noted above   Edema of both legs  - unclear etiology, will check UA to make sure no proteinuria  - Venous doppler pending   Obesity  - regular diet   Tobacco use disorder  - cessation discussed in detail  Hyponatremia -Resolved    Code Status: Full Family Communication:  Disposition Plan: If remains afebrile overnight will consider discharge    Consultants:  None Procedures/Studies:  Dg Chest Port 1 View 06/13/2013 No active disease.  Antibiotics:  Doxycycline 11/08 -->     HPI/Subjective: 27 y.o.WM PMHx tobacco abuse  presented to [facility] ED with persistent fevers and worsening rash that initially started several days prior to this admission and associated with bilateral lower extremity edema. Pt explains that rash initially started in lower extremities and has now progressed to abdominal area, its non pruritic and is painless. He was seen in [facility] ED earlier prior to this admission and was started on Doxycycline. His rash continued to progress and pt came to [facility]. Pt has 3 yo child at home with URI illness with cough and congestions. TODAY patient states did go camping/hunting this summer and found ticks on his body but they were not embedded    Objective: Filed Vitals:   06/14/13 1359 06/14/13 2100 06/15/13 0200 06/15/13  0545  BP: 134/73 118/54 117/67 104/65  Pulse: 87 78 75 72  Temp: 97.8 F (36.6 C) 98.7 F (37.1 C) 98 F (36.7 C) 98.2 F (36.8 C)  TempSrc: Oral Oral Oral Oral  Resp: 18 18 18 18   Height:      Weight:      SpO2: 96% 98% 97% 97%    Intake/Output Summary (Last 24 hours) at 06/15/13 0939 Last data filed at 06/15/13 0900  Gross per 24 hour  Intake 2862.83 ml  Output    900 ml  Net 1962.83 ml   Filed Weights   06/13/13 2204  Weight: 136.079 kg (300 lb)    Exam:   General:  A./O. x4, NAD  Cardiovascular: Regular rhythm and rate, negative murmurs rubs or gallops, DP/PT pulse +1 bilateral  Respiratory: Clear to auscultation bilateral  Abdomen: Maculopapular rash, fading  Musculoskeletal: Maculopapular rash, fading, positive +2 pedal edema to mid calf (per patient definite improvement)   Data Reviewed: Basic Metabolic Panel:  Recent Labs Lab 06/13/13 1232 06/13/13 1907 06/14/13 0422 06/15/13 0437  NA 131* 135 132* 138  K 3.4* 3.8 3.7 3.6  CL 95* 99 99 105  CO2 25 28 20 26   GLUCOSE 116* 136* 91 90  BUN 12 14 13 11   CREATININE 0.88 0.86 0.84 0.78  CALCIUM 9.0 8.6 8.5 8.6   Liver Function Tests:  Recent Labs Lab 06/13/13 1232 06/13/13 1907 06/14/13 0857 06/15/13 0437  AST 34 30 27 21   ALT 34 31 26 24   ALKPHOS 68 66 60 58  BILITOT 0.5 0.5 0.5  0.4  PROT 7.0 6.6 6.0 6.0  ALBUMIN 3.5 3.2* 3.0* 2.8*   No results found for this basename: LIPASE, AMYLASE,  in the last 168 hours No results found for this basename: AMMONIA,  in the last 168 hours CBC:  Recent Labs Lab 06/13/13 1232 06/13/13 1907 06/14/13 0422 06/15/13 0437  WBC 5.7 5.7 5.0 4.4  NEUTROABS 4.4 4.1  --   --   HGB 14.9 14.6 13.4 12.7*  HCT 42.3 40.6 38.3* 36.9*  MCV 85.1 84.9 85.1 85.6  PLT 168 162 174 185   Cardiac Enzymes: No results found for this basename: CKTOTAL, CKMB, CKMBINDEX, TROPONINI,  in the last 168 hours BNP (last 3 results) No results found for this basename:  PROBNP,  in the last 8760 hours CBG: No results found for this basename: GLUCAP,  in the last 168 hours  Recent Results (from the past 240 hour(s))  CULTURE, BLOOD (ROUTINE X 2)     Status: None   Collection Time    06/13/13 12:32 PM      Result Value Range Status   Specimen Description BLOOD LEFT ARM   Final   Special Requests     Final   Value: BOTTLES DRAWN AEROBIC AND ANAEROBIC 8CC EACH BOTTLE   Culture NO GROWTH 1 DAY   Final   Report Status PENDING   Incomplete  CULTURE, BLOOD (ROUTINE X 2)     Status: None   Collection Time    06/13/13 12:33 PM      Result Value Range Status   Specimen Description BLOOD LEFT HAND   Final   Special Requests     Final   Value: BOTTLES DRAWN AEROBIC AND ANAEROBIC 10CC EACH BOTTLE   Culture NO GROWTH 1 DAY   Final   Report Status PENDING   Incomplete     Studies: Dg Chest Port 1 View  06/13/2013   CLINICAL DATA:  Chest pain.  EXAM: PORTABLE CHEST - 1 VIEW  COMPARISON:  None.  FINDINGS: The heart size and mediastinal contours are within normal limits. Both lungs are clear. The visualized skeletal structures are unremarkable.  IMPRESSION: No active disease.   Electronically Signed   By: Elige Ko   On: 06/13/2013 12:28    Scheduled Meds: . doxycycline  100 mg Oral BID  . enoxaparin (LOVENOX) injection  70 mg Subcutaneous QHS  . pneumococcal 23 valent vaccine  0.5 mL Intramuscular Tomorrow-1000   Continuous Infusions: . sodium chloride 75 mL/hr at 06/14/13 2138    Principal Problem:   Febrile illness Active Problems:   Cutaneous eruption   Edema of both legs   Obesity   Tobacco use disorder    Time spent: 40 minutes    WOODS, CURTIS, J  [facility] Pager [phone]. If 7PM-7AM, please contact night-coverage at [email or web address], password [facility] 06/15/2013, 9:39 AM  LOS: 2 days

## 2013-06-16 LAB — RESPIRATORY VIRUS PANEL
Adenovirus: NOT DETECTED
Influenza A H1: NOT DETECTED
Influenza A H3: NOT DETECTED
Influenza A: NOT DETECTED
Influenza B: NOT DETECTED
Metapneumovirus: NOT DETECTED
Parainfluenza 2: NOT DETECTED
Respiratory Syncytial Virus A: NOT DETECTED
Rhinovirus: NOT DETECTED

## 2013-06-16 LAB — EPSTEIN-BARR VIRUS VCA ANTIBODY PANEL: EBV NA IgG: 355 U/mL — ABNORMAL HIGH (ref <18.0)

## 2013-06-16 LAB — CMV IGM: CMV IgM: <8 AU/mL (ref <30.00)

## 2013-06-16 MED ORDER — DOXYCYCLINE HYCLATE 100 MG PO TABS: 100.0000 mg | ORAL_TABLET | Freq: Two times a day (BID) | ORAL | Status: AC

## 2013-06-16 NOTE — Discharge Summary (Signed)
Physician Discharge Summary  NOBLE CICALESE ZOX:[id number] DOB: Aug 20, 1985 DOA: 06/13/2013  PCP: No PCP Per Patient  Admit date: 06/13/2013 Discharge date: 06/16/2013  Time spent: 40 minutes  Recommendations for Outpatient Follow-up:   Febrile illness  - unclear etiology; Last Fever 101.4 on 11/8  - will place order for HIV, ANA, viral panel, hepatitis panel  - Lyme disease(negative)  -RMSF(negative)  - Ehrlichia(ordered 11/10); pending  -HIV(negative)  -ANA (negative)  -Viral Panel  Hepatitis Panel;  -ESR = 45 (HIGH)  -Parvo Virus (Pending)  -CMV Pending  -RSV Pending  -EBV positive  - continue doxycycline ; rash and pedal edema resolving  - Counseled patient that EBV most likely cause of his rash however Ehrlichiosis pending we'll therefore have patient complete course of doxycycline 100 mg BID x 4 more days  Cutaneous eruption  - unclear etiology and if related to principal problem  - ANA ordered, other tests as noted above   Edema of both legs  - unclear etiology, will check UA to make sure no proteinuria  - Venous doppler pending   Obesity  - regular diet   Tobacco use disorder  - cessation discussed in detail   Hyponatremia  -Resolved    Discharge Diagnoses:  Principal Problem:   Febrile illness Active Problems:   Cutaneous eruption   Edema of both legs   Obesity   Tobacco use disorder   Hyponatremia   Discharge Condition: Stable  Diet recommendation: Regular  Filed Weights   06/13/13 2204  Weight: 136.079 kg (300 lb)    History of present illness:  27 y.o.WM PMHx tobacco abuse presented to [facility] ED with persistent fevers and worsening rash that initially started several days prior to this admission and associated with bilateral lower extremity edema. Pt explains that rash initially started in lower extremities and has now progressed to abdominal area, its non pruritic and is painless. He was seen in [facility] ED earlier prior to this admission  and was started on Doxycycline. His rash continued to progress and pt came to [facility]. Pt has 3 yo child at home with URI illness with cough and congestions. 06/15/2013 patient states did go camping/hunting this summer and found ticks on his body but they were not embedded TODAY states rash continues to resolve. States pedal edema continued to resolve. Ready for discharge   Consultants:  None   Procedures/Studies:  Dg Chest Port 1 View 06/13/2013 No active disease.    Antibiotics:  Doxycycline 11/08 -->   Discharge Exam: Filed Vitals:   06/15/13 1800 06/15/13 2222 06/16/13 0155 06/16/13 0618  BP: 153/76 123/75 109/68 107/62  Pulse: 74 82 77 64  Temp: 98.2 F (36.8 C) 98 F (36.7 C) 97.7 F (36.5 C) 98.2 F (36.8 C)  TempSrc: Oral Oral Oral Oral  Resp: 18 18 18 18   Height:      Weight:      SpO2: 99% 97% 96% 96%   General: A./O. x4, NAD  Cardiovascular: Regular rhythm and rate, negative murmurs rubs or gallops, DP/PT pulse +1 bilateral  Respiratory: Clear to auscultation bilateral  Abdomen: Maculopapular rash, fading  Musculoskeletal: Maculopapular rash, fading (has almost resolved), positive +1 pedal edema to mid calf (continues to improve)   Discharge Instructions     Medication List    ASK your doctor about these medications       doxycycline 100 MG capsule  Commonly known as:  VIBRAMYCIN  Take 1 capsule (100 mg total) by mouth 2 (two)  times daily.     HYDROcodone-acetaminophen 5-325 MG per tablet  Commonly known as:  NORCO/VICODIN  Take 1-2 tablets by mouth every 6 (six) hours as needed for moderate pain.     ibuprofen 200 MG tablet  Commonly known as:  ADVIL,MOTRIN  Take 600 mg by mouth every 6 (six) hours as needed for headache or moderate pain.     MELATONIN PO  Take 2 tablets by mouth as needed (sleep).       No Known Allergies    The results of significant diagnostics from this hospitalization (including imaging, microbiology, ancillary and  laboratory) are listed below for reference.    Significant Diagnostic Studies: Dg Chest Port 1 View  06/13/2013   CLINICAL DATA:  Chest pain.  EXAM: PORTABLE CHEST - 1 VIEW  COMPARISON:  None.  FINDINGS: The heart size and mediastinal contours are within normal limits. Both lungs are clear. The visualized skeletal structures are unremarkable.  IMPRESSION: No active disease.   Electronically Signed   By: Elige Ko   On: 06/13/2013 12:28    Microbiology: Recent Results (from the past 240 hour(s))  CULTURE, BLOOD (ROUTINE X 2)     Status: None   Collection Time    06/13/13 12:32 PM      Result Value Range Status   Specimen Description BLOOD LEFT ARM   Final   Special Requests     Final   Value: BOTTLES DRAWN AEROBIC AND ANAEROBIC 8CC EACH BOTTLE   Culture NO GROWTH 3 DAYS   Final   Report Status PENDING   Incomplete  CULTURE, BLOOD (ROUTINE X 2)     Status: None   Collection Time    06/13/13 12:33 PM      Result Value Range Status   Specimen Description BLOOD LEFT HAND   Final   Special Requests     Final   Value: BOTTLES DRAWN AEROBIC AND ANAEROBIC 10CC EACH BOTTLE   Culture NO GROWTH 3 DAYS   Final   Report Status PENDING   Incomplete     Labs: Basic Metabolic Panel:  Recent Labs Lab 06/13/13 1232 06/13/13 1907 06/14/13 0422 06/15/13 0437  NA 131* 135 132* 138  K 3.4* 3.8 3.7 3.6  CL 95* 99 99 105  CO2 25 28 20 26   GLUCOSE 116* 136* 91 90  BUN 12 14 13 11   CREATININE 0.88 0.86 0.84 0.78  CALCIUM 9.0 8.6 8.5 8.6   Liver Function Tests:  Recent Labs Lab 06/13/13 1232 06/13/13 1907 06/14/13 0857 06/15/13 0437  AST 34 30 27 21   ALT 34 31 26 24   ALKPHOS 68 66 60 58  BILITOT 0.5 0.5 0.5 0.4  PROT 7.0 6.6 6.0 6.0  ALBUMIN 3.5 3.2* 3.0* 2.8*   No results found for this basename: LIPASE, AMYLASE,  in the last 168 hours No results found for this basename: AMMONIA,  in the last 168 hours CBC:  Recent Labs Lab 06/13/13 1232 06/13/13 1907 06/14/13 0422  06/15/13 0437  WBC 5.7 5.7 5.0 4.4  NEUTROABS 4.4 4.1  --   --   HGB 14.9 14.6 13.4 12.7*  HCT 42.3 40.6 38.3* 36.9*  MCV 85.1 84.9 85.1 85.6  PLT 168 162 174 185   Cardiac Enzymes: No results found for this basename: CKTOTAL, CKMB, CKMBINDEX, TROPONINI,  in the last 168 hours BNP: BNP (last 3 results) No results found for this basename: PROBNP,  in the last 8760 hours CBG: No results found for this  basename: GLUCAP,  in the last 168 hours     Signed:  Carolyne Littles, MD [facility] [phone] pager

## 2013-06-17 LAB — EHRLICHIA ANTIBODY PANEL

## 2013-06-17 LAB — PARVOVIRUS B19 ANTIBODY, IGG AND IGM
Parovirus B19 IgG Abs: 2.3 index — ABNORMAL HIGH (ref <0.9)
Parovirus B19 IgM Abs: 0.1 index (ref <0.9)

## 2013-06-18 LAB — CULTURE, BLOOD (ROUTINE X 2)
Culture: NO GROWTH
Culture: NO GROWTH

## 2013-06-19 LAB — ROCKY MTN SPOTTED FVR AB, IGM-BLOOD: RMSF IgM: 0.24 IV (ref 0.00–0.89)

## 2013-06-19 LAB — RSV(RESPIRATORY SYNCYTIAL VIRUS) AB, BLOOD

## 2013-06-19 LAB — ROCKY MTN SPOTTED FVR AB, IGG-BLOOD: RMSF IgG: 0.19 IV

## 2013-06-24 NOTE — ED Provider Notes (Signed)
Medical screening examination/treatment/procedure(s) were conducted as a shared visit with non-physician practitioner(s) and myself.  I personally evaluated the patient during the encounter.  EKG Interpretation   None         Layla Maw Bedelia Pong, DO 06/24/13 [phone]

## 2020-04-25 ENCOUNTER — Emergency Department (HOSPITAL_COMMUNITY): Payer: BC Managed Care – PPO

## 2020-04-25 ENCOUNTER — Emergency Department (HOSPITAL_COMMUNITY)
Admission: EM | Admit: 2020-04-25 | Discharge: 2020-04-26 | Disposition: A | Discharge status: Home / Self Care | Payer: BC Managed Care – PPO | Attending: Emergency Medicine | Admitting: Emergency Medicine

## 2020-04-25 ENCOUNTER — Other Ambulatory Visit: Payer: Self-pay

## 2020-04-25 ENCOUNTER — Encounter (HOSPITAL_COMMUNITY): Payer: Self-pay | Admitting: Emergency Medicine

## 2020-04-25 DIAGNOSIS — F17210 Nicotine dependence, cigarettes, uncomplicated: Secondary | ICD-10-CM | POA: Insufficient documentation

## 2020-04-25 DIAGNOSIS — S299XXA Unspecified injury of thorax, initial encounter: Secondary | ICD-10-CM | POA: Diagnosis present

## 2020-04-25 DIAGNOSIS — Y92838 Other recreation area as the place of occurrence of the external cause: Secondary | ICD-10-CM | POA: Insufficient documentation

## 2020-04-25 DIAGNOSIS — S20219A Contusion of unspecified front wall of thorax, initial encounter: Secondary | ICD-10-CM | POA: Insufficient documentation

## 2020-04-25 DIAGNOSIS — W19XXXA Unspecified fall, initial encounter: Secondary | ICD-10-CM

## 2020-04-25 DIAGNOSIS — W11XXXA Fall on and from ladder, initial encounter: Secondary | ICD-10-CM | POA: Diagnosis not present

## 2020-04-25 DIAGNOSIS — R1013 Epigastric pain: Secondary | ICD-10-CM | POA: Diagnosis not present

## 2020-04-25 LAB — CBC WITH DIFFERENTIAL/PLATELET
Abs Immature Granulocytes: 0.04 10*3/uL (ref 0.00–0.07)
Basophils Absolute: 0.1 10*3/uL (ref 0.0–0.1)
Basophils Relative: 0 %
Eosinophils Absolute: 0.2 10*3/uL (ref 0.0–0.5)
Eosinophils Relative: 2 %
HCT: 49.9 % (ref 39.0–52.0)
Hemoglobin: 16 g/dL (ref 13.0–17.0)
Immature Granulocytes: 0 %
Lymphocytes Relative: 10 %
Lymphs Abs: 1.3 10*3/uL (ref 0.7–4.0)
MCH: 29.3 pg (ref 26.0–34.0)
MCHC: 32.1 g/dL (ref 30.0–36.0)
MCV: 91.4 fL (ref 80.0–100.0)
Monocytes Absolute: 0.8 10*3/uL (ref 0.1–1.0)
Monocytes Relative: 6 %
Neutro Abs: 10.9 10*3/uL — ABNORMAL HIGH (ref 1.7–7.7)
Neutrophils Relative %: 82 %
Platelets: 310 10*3/uL (ref 150–400)
RBC: 5.46 MIL/uL (ref 4.22–5.81)
RDW: 13 % (ref 11.5–15.5)
WBC: 13.3 10*3/uL — ABNORMAL HIGH (ref 4.0–10.5)
nRBC: 0 % (ref 0.0–0.2)

## 2020-04-25 LAB — BASIC METABOLIC PANEL
Anion gap: 11 (ref 5–15)
BUN: 14 mg/dL (ref 6–20)
CO2: 20 mmol/L — ABNORMAL LOW (ref 22–32)
Calcium: 8.8 mg/dL — ABNORMAL LOW (ref 8.9–10.3)
Chloride: 104 mmol/L (ref 98–111)
Creatinine, Ser: 0.74 mg/dL (ref 0.61–1.24)
GFR calc Af Amer: >60 mL/min (ref >60)
GFR calc non Af Amer: >60 mL/min (ref >60)
Glucose, Bld: 90 mg/dL (ref 70–99)
Potassium: 3.9 mmol/L (ref 3.5–5.1)
Sodium: 135 mmol/L (ref 135–145)

## 2020-04-25 LAB — TROPONIN I (HIGH SENSITIVITY): Troponin I (High Sensitivity): 3 ng/L (ref <18)

## 2020-04-25 MED ORDER — IOHEXOL 300 MG/ML  SOLN
100.0000 mL | Freq: Once | INTRAMUSCULAR | Status: AC | PRN
  Administered 2020-04-25: 100 mL via INTRAVENOUS

## 2020-04-25 NOTE — ED Provider Notes (Signed)
Emergency Department Provider Note   I have reviewed the triage vital signs and the nursing notes.   HISTORY  Chief Complaint Fall (12 feet)   HPI Craig Stout is a 34 y.o. male with PMH reviewed below presents to the ED after fall. Patient fell 6 ft from a ladder onto a deck railing which struck him across the anterior lower chest and upper abdomen. He then fell an additional approx 8 feet to the ground landing on his back. He denies head injury or LOC. Patient arrived to the ED with pain in the forearm and pain with deep breathing. Denies neck pain, numbness, or weakness. Notes some bruising to the chest/abdomen area and pain worse with movement. No radiation of symptoms or other modifying factors.    Past Medical History:  Diagnosis Date  . Obesity     Patient Active Problem List   Diagnosis Date Noted  . Hyponatremia 06/15/2013  . Febrile illness 06/13/2013  . Cutaneous eruption 06/13/2013  . Edema of both legs 06/13/2013  . Obesity 06/13/2013  . Tobacco use disorder 06/13/2013    Past Surgical History:  Procedure Laterality Date  . NO PAST SURGERIES      Allergies Patient has no known allergies.  Family History  Problem Relation Age of Onset  . Stroke Maternal Grandmother   . Diabetes Father     Social History Social History   Tobacco Use  . Smoking status: Current Every Day Smoker    Packs/day: 1.00    Types: Cigarettes  . Smokeless tobacco: Never Used  Substance Use Topics  . Alcohol use: Yes    Comment: rarely  . Drug use: Never    Review of Systems  Constitutional: No fever/chills Eyes: No visual changes. ENT: No sore throat. Cardiovascular: Positive lower chest wall pain.  Respiratory: Denies shortness of breath. Gastrointestinal: Positive epigastric abdominal pain.  No nausea, no vomiting.  No diarrhea.  No constipation. Genitourinary: Negative for dysuria. Musculoskeletal: Negative for back pain. Positive right forearm pain.  Skin:  Negative for rash. Bruising over the anterior chest.  Neurological: Negative for headaches, focal weakness or numbness.  10-point ROS otherwise negative.  ____________________________________________   PHYSICAL EXAM:  VITAL SIGNS: ED Triage Vitals  Enc Vitals Group     BP 04/25/20 1550 (!) 142/75     Pulse Rate 04/25/20 1550 80     Resp 04/25/20 1550 20     Temp 04/25/20 1550 98.4 F (36.9 C)     Temp Source 04/25/20 1550 Oral     SpO2 04/25/20 1550 97 %     Weight 04/25/20 1553 300 lb (136.1 kg)     Height 04/25/20 1553 6' (1.829 m)   Constitutional: Alert and oriented. Well appearing and in no acute distress. Eyes: Conjunctivae are normal.  Head: Atraumatic. Nose: No congestion/rhinnorhea. Mouth/Throat: Mucous membranes are moist.  Oropharynx non-erythematous. Neck: No stridor. Mild mainly paracervical spine tenderness.  Cardiovascular: Normal rate, regular rhythm. Good peripheral circulation. Grossly normal heart sounds.   Respiratory: Normal respiratory effort with some splinting noted.  No retractions. Lungs CTAB. Gastrointestinal: Soft with focal epigastric tenderness and bruising. no bruising over the flanks. No distention.  Musculoskeletal: No lower extremity tenderness nor edema. No gross deformities of extremities. Tenderness over the right forearm but preserved ROM.  Neurologic:  Normal speech and language. No gross focal neurologic deficits are appreciated. Normal gait.  Skin:  Skin is warm, dry and intact. Bruising noted to the chest and abdominal wall  as described.   ____________________________________________   LABS (all labs ordered are listed, but only abnormal results are displayed)  Labs Reviewed  BASIC METABOLIC PANEL - Abnormal; Notable for the following components:      Result Value   CO2 20 (*)    Calcium 8.8 (*)    All other components within normal limits  CBC WITH DIFFERENTIAL/PLATELET - Abnormal; Notable for the following components:   WBC  13.3 (*)    Neutro Abs 10.9 (*)    All other components within normal limits  TROPONIN I (HIGH SENSITIVITY)  TROPONIN I (HIGH SENSITIVITY)   ____________________________________________  EKG   EKG Interpretation  Date/Time:  Monday April 25 2020 15:53:40 EDT Ventricular Rate:  80 PR Interval:  164 QRS Duration: 112 QT Interval:  370 QTC Calculation: 426 R Axis:   22 Text Interpretation: Normal sinus rhythm Incomplete right bundle branch block Inferior infarct , age undetermined Cannot rule out Anterior infarct , age undetermined Abnormal ECG No STEMI Confirmed by Alona Bene [phone]) on 04/25/2020 9:51:55 PM       ____________________________________________  RADIOLOGY  DG Chest 2 View  Result Date: 04/25/2020 CLINICAL DATA:  Fall from ladder, shortness of breath, pain with inspiration EXAM: CHEST - 2 VIEW COMPARISON:  06/09/2017 FINDINGS: Stable chronic lateral soft tissue density over the right hemithorax with some linear calcification, suspect chronic pleural thickening as before. Normal heart size and vascularity. No superimposed acute chest process, collapse or consolidation. No edema, effusion or pneumothorax. Trachea midline. No acute osseous finding. IMPRESSION: Stable chest exam.  No acute finding by plain radiography Electronically Signed   By: Judie Petit.  Shick M.D.   On: 04/25/2020 16:29   DG Forearm Right  Result Date: 04/25/2020 CLINICAL DATA:  Larey Seat from ladder EXAM: RIGHT FOREARM - 2 VIEW COMPARISON:  None. FINDINGS: Frontal and lateral views of the right forearm demonstrate no acute fractures. Alignment of the right wrist and elbow is anatomic. Joint spaces are well preserved. Soft tissues are normal. IMPRESSION: 1. Unremarkable right forearm. Electronically Signed   By: Sharlet Salina M.D.   On: 04/25/2020 16:27   CT Head Wo Contrast  Result Date: 04/26/2020 CLINICAL DATA:  Fall from ladder, head injury, neck pain EXAM: CT HEAD WITHOUT CONTRAST CT CERVICAL SPINE  WITHOUT CONTRAST TECHNIQUE: Multidetector CT imaging of the head and cervical spine was performed following the standard protocol without intravenous contrast. Multiplanar CT image reconstructions of the cervical spine were also generated. COMPARISON:  None. FINDINGS: CT HEAD FINDINGS Brain: Normal anatomic configuration. No abnormal intra or extra-axial mass lesion or fluid collection. No abnormal mass effect or midline shift. No evidence of acute intracranial hemorrhage or infarct. Ventricular size is normal. Cerebellum unremarkable. Vascular: Unremarkable Skull: Intact Sinuses/Orbits: Paranasal sinuses are clear. Orbits are unremarkable. Other: Mastoid air cells and middle ear cavities are clear. CT CERVICAL SPINE FINDINGS Alignment: Normal. Skull base and vertebrae: No acute fracture. No primary bone lesion or focal pathologic process. Soft tissues and spinal canal: No prevertebral fluid or swelling. No visible canal hematoma. Disc levels: Review of the sagittal images demonstrates preservation of vertebral body height and intervertebral disc height. Review of the axial images demonstrates no significant uncovertebral or facet arthrosis. No significant neural foraminal narrowing or canal stenosis. Upper chest: Negative. Other: None significant IMPRESSION: No acute intracranial abnormality. No acute fracture or listhesis of the cervical spine. Electronically Signed   By: Helyn Numbers MD   On: 04/26/2020 00:32   CT Chest W Contrast  Result Date: 04/26/2020 CLINICAL DATA:  Recent fall from ladder with chest pain, initial encounter EXAM: CT CHEST, ABDOMEN, AND PELVIS WITH CONTRAST TECHNIQUE: Multidetector CT imaging of the chest, abdomen and pelvis was performed following the standard protocol during bolus administration of intravenous contrast. CONTRAST:  100mL OMNIPAQUE IOHEXOL 300 MG/ML  SOLN COMPARISON:  None. FINDINGS: CT CHEST FINDINGS Cardiovascular: No cardiac enlargement is seen. Thoracic aorta is  within normal limits. No aortic abnormality is seen. The pulmonary artery as visualized is within normal limits. Mediastinum/Nodes: Thoracic inlet is within normal limits. No hilar or mediastinal adenopathy is noted. No mediastinal hematoma is seen. The esophagus as visualized is within normal limits. Lungs/Pleura: Lungs are well aerated bilaterally. Minimal atelectatic changes are noted. No effusion or pneumothorax is seen. Pleural thickening is noted on the right with subpleural fatty infiltration and bony calcification stable in appearance from 2014. No parenchymal nodules are seen. No pleural effusion is noted. Musculoskeletal: No acute bony abnormality is noted. CT ABDOMEN PELVIS FINDINGS Hepatobiliary: No focal liver abnormality is seen. No gallstones, gallbladder wall thickening, or biliary dilatation. Pancreas: Unremarkable. No pancreatic ductal dilatation or surrounding inflammatory changes. Spleen: Normal in size without focal abnormality. Adrenals/Urinary Tract: Adrenal glands are within normal limits. Kidneys demonstrate a normal enhancement pattern. No renal calculi or urinary tract obstructive changes are noted. Bladder is partially distended. Stomach/Bowel: Scattered diverticular change of the colon is noted without evidence of diverticulitis. The appendix is within normal limits. Small bowel and stomach are unremarkable. Vascular/Lymphatic: No significant vascular findings are present. No enlarged abdominal or pelvic lymph nodes. Reproductive: Prostate is unremarkable. Other: No abdominal wall hernia or abnormality. No abdominopelvic ascites. Musculoskeletal: Bilateral pars defects are noted at L5 without significant anterolisthesis. No acute bony abnormality is noted. IMPRESSION: CT of the chest: Chronic changes in the right chest wall with pleural thickening and fatty deposition as well as linear dystrophic ossification. These changes are stable dating back to 2014 and likely developmental in  nature. Mild bibasilar atelectatic changes are seen. CT of the abdomen and pelvis: Diverticular change without diverticulitis. No acute abnormality is noted. Electronically Signed   By: Alcide CleverMark  Lukens M.D.   On: 04/26/2020 00:33   CT Cervical Spine Wo Contrast  Result Date: 04/26/2020 CLINICAL DATA:  Fall from ladder, head injury, neck pain EXAM: CT HEAD WITHOUT CONTRAST CT CERVICAL SPINE WITHOUT CONTRAST TECHNIQUE: Multidetector CT imaging of the head and cervical spine was performed following the standard protocol without intravenous contrast. Multiplanar CT image reconstructions of the cervical spine were also generated. COMPARISON:  None. FINDINGS: CT HEAD FINDINGS Brain: Normal anatomic configuration. No abnormal intra or extra-axial mass lesion or fluid collection. No abnormal mass effect or midline shift. No evidence of acute intracranial hemorrhage or infarct. Ventricular size is normal. Cerebellum unremarkable. Vascular: Unremarkable Skull: Intact Sinuses/Orbits: Paranasal sinuses are clear. Orbits are unremarkable. Other: Mastoid air cells and middle ear cavities are clear. CT CERVICAL SPINE FINDINGS Alignment: Normal. Skull base and vertebrae: No acute fracture. No primary bone lesion or focal pathologic process. Soft tissues and spinal canal: No prevertebral fluid or swelling. No visible canal hematoma. Disc levels: Review of the sagittal images demonstrates preservation of vertebral body height and intervertebral disc height. Review of the axial images demonstrates no significant uncovertebral or facet arthrosis. No significant neural foraminal narrowing or canal stenosis. Upper chest: Negative. Other: None significant IMPRESSION: No acute intracranial abnormality. No acute fracture or listhesis of the cervical spine. Electronically Signed   By: Helyn NumbersAshesh  Parikh  MD   On: 04/26/2020 00:32   CT ABDOMEN PELVIS W CONTRAST  Result Date: 04/26/2020 CLINICAL DATA:  Recent fall from ladder with chest pain,  initial encounter EXAM: CT CHEST, ABDOMEN, AND PELVIS WITH CONTRAST TECHNIQUE: Multidetector CT imaging of the chest, abdomen and pelvis was performed following the standard protocol during bolus administration of intravenous contrast. CONTRAST:  OMNIPAQUE IOHEXOL 300 MG/ML  SOLN COMPARISON:  None. FINDINGS: CT CHEST FINDINGS Cardiovascular: No cardiac enlargement is seen. Thoracic aorta is within normal limits. No aortic abnormality is seen. The pulmonary artery as visualized is within normal limits. Mediastinum/Nodes: Thoracic inlet is within normal limits. No hilar or mediastinal adenopathy is noted. No mediastinal hematoma is seen. The esophagus as visualized is within normal limits. Lungs/Pleura: Lungs are well aerated bilaterally. Minimal atelectatic changes are noted. No effusion or pneumothorax is seen. Pleural thickening is noted on the right with subpleural fatty infiltration and bony calcification stable in appearance from 2014. No parenchymal nodules are seen. No pleural effusion is noted. Musculoskeletal: No acute bony abnormality is noted. CT ABDOMEN PELVIS FINDINGS Hepatobiliary: No focal liver abnormality is seen. No gallstones, gallbladder wall thickening, or biliary dilatation. Pancreas: Unremarkable. No pancreatic ductal dilatation or surrounding inflammatory changes. Spleen: Normal in size without focal abnormality. Adrenals/Urinary Tract: Adrenal glands are within normal limits. Kidneys demonstrate a normal enhancement pattern. No renal calculi or urinary tract obstructive changes are noted. Bladder is partially distended. Stomach/Bowel: Scattered diverticular change of the colon is noted without evidence of diverticulitis. The appendix is within normal limits. Small bowel and stomach are unremarkable. Vascular/Lymphatic: No significant vascular findings are present. No enlarged abdominal or pelvic lymph nodes. Reproductive: Prostate is unremarkable. Other: No abdominal wall hernia or  abnormality. No abdominopelvic ascites. Musculoskeletal: Bilateral pars defects are noted at L5 without significant anterolisthesis. No acute bony abnormality is noted. IMPRESSION: CT of the chest: Chronic changes in the right chest wall with pleural thickening and fatty deposition as well as linear dystrophic ossification. These changes are stable dating back to 2014 and likely developmental in nature. Mild bibasilar atelectatic changes are seen. CT of the abdomen and pelvis: Diverticular change without diverticulitis. No acute abnormality is noted. Electronically Signed   By: Alcide Clever M.D.   On: 04/26/2020 00:33    ____________________________________________   PROCEDURES  Procedure(s) performed:   Procedures  None ____________________________________________   INITIAL IMPRESSION / ASSESSMENT AND PLAN / ED COURSE  Pertinent labs & imaging results that were available during my care of the patient were reviewed by me and considered in my medical decision making (see chart for details).   Patient presents emergency department evaluation after fall from a ladder onto some railing and then additional fall from there. He has bruising to the lower chest wall and abdomen which correlates with his area of tenderness. Patient does have a somewhat abnormal EKG with no prior tracings for comparison. I will add on a single troponin but my suspicion for ACS or contusion is low. Plain films from triage reviewed but given the mechanism and bruising and seen on exam I do plan for CT imaging of the head, C-spine, chest, abdomen, pelvis. The patient does not have midline C-spine tenderness but question distracting injury in the chest wall/upper abdomen.  CT imaging pending. Care transferred to Dr. Wilkie Aye pending CT.  ____________________________________________  FINAL CLINICAL IMPRESSION(S) / ED DIAGNOSES  Final diagnoses:  Fall, initial encounter  Contusion of chest wall, unspecified laterality,  initial encounter  Epigastric pain  MEDICATIONS GIVEN DURING THIS VISIT:  Medications  iohexol (OMNIPAQUE) 300 MG/ML solution 100 mL (100 mLs Intravenous Contrast Given 04/25/20 2354)     Note:  This document was prepared using Dragon voice recognition software and may include unintentional dictation errors.  Alona Bene, MD, [facility] Emergency Medicine    Maylyn Narvaiz, Arlyss Repress, MD 04/26/20 [phone]

## 2020-04-25 NOTE — ED Triage Notes (Signed)
Patient was working on ladder, fell 6 ft, chest hit deck, then fell another 6 ft, landing on his back.  Currently having pain, when inhaling and right forearm pain.

## 2020-04-26 MED ORDER — IBUPROFEN 200 MG PO TABS: 600.0000 mg | ORAL_TABLET | Freq: Four times a day (QID) | ORAL | 0 refills | Status: AC | PRN

## 2020-04-26 NOTE — Discharge Instructions (Signed)
You were seen today after a fall.  Your imaging does not show any evidence of fracture or traumatic injury.  You will likely be very sore.  Take ibuprofen as needed for pain or discomfort.

## 2020-04-26 NOTE — ED Provider Notes (Signed)
Patient signed out pending CT scan.  In brief patient fell over a railing onto his back.  He had blunt abdominal and chest wall trauma.  Plain films were negative.  CT chest abdomen pelvis as well as head and neck were obtained to rule out any significant traumatic injury.  CT scans reviewed and showed no obvious intrathoracic, intra-abdominal or intracranial injury.  Patient will likely be very sore in the next few days.  Recommend 600 mg of ibuprofen for pain.  After history, exam, and medical workup I feel the patient has been appropriately medically screened and is safe for discharge home. Pertinent diagnoses were discussed with the patient. Patient was given return precautions.    Shon Baton, MD 04/26/20 [phone]

## 2021-06-18 IMAGING — DX DG CHEST 2V
2 series · 2 of 2 positions shown · non-contrast
Comparison: 06/09/2017

CLINICAL DATA: Fall from ladder, shortness of breath, pain with
inspiration

EXAM:
CHEST - 2 VIEW

[chest pa]
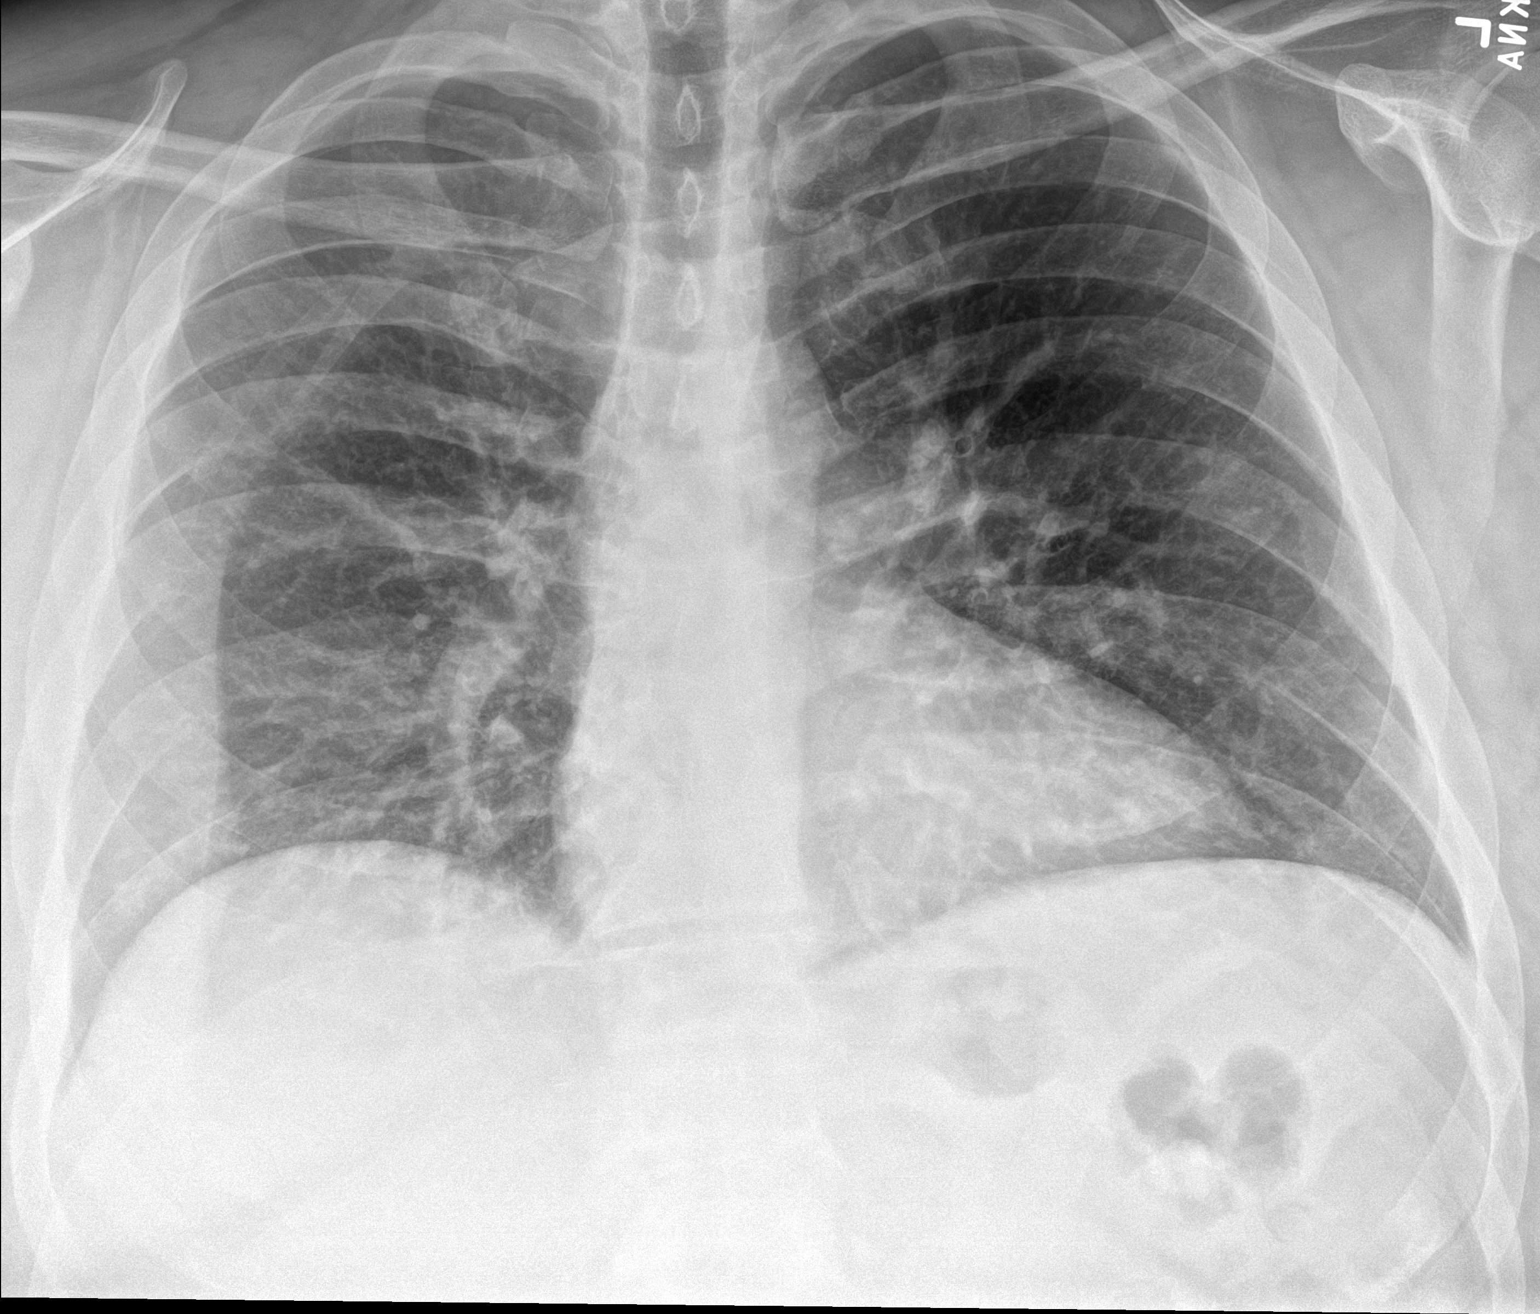

[chest lat]
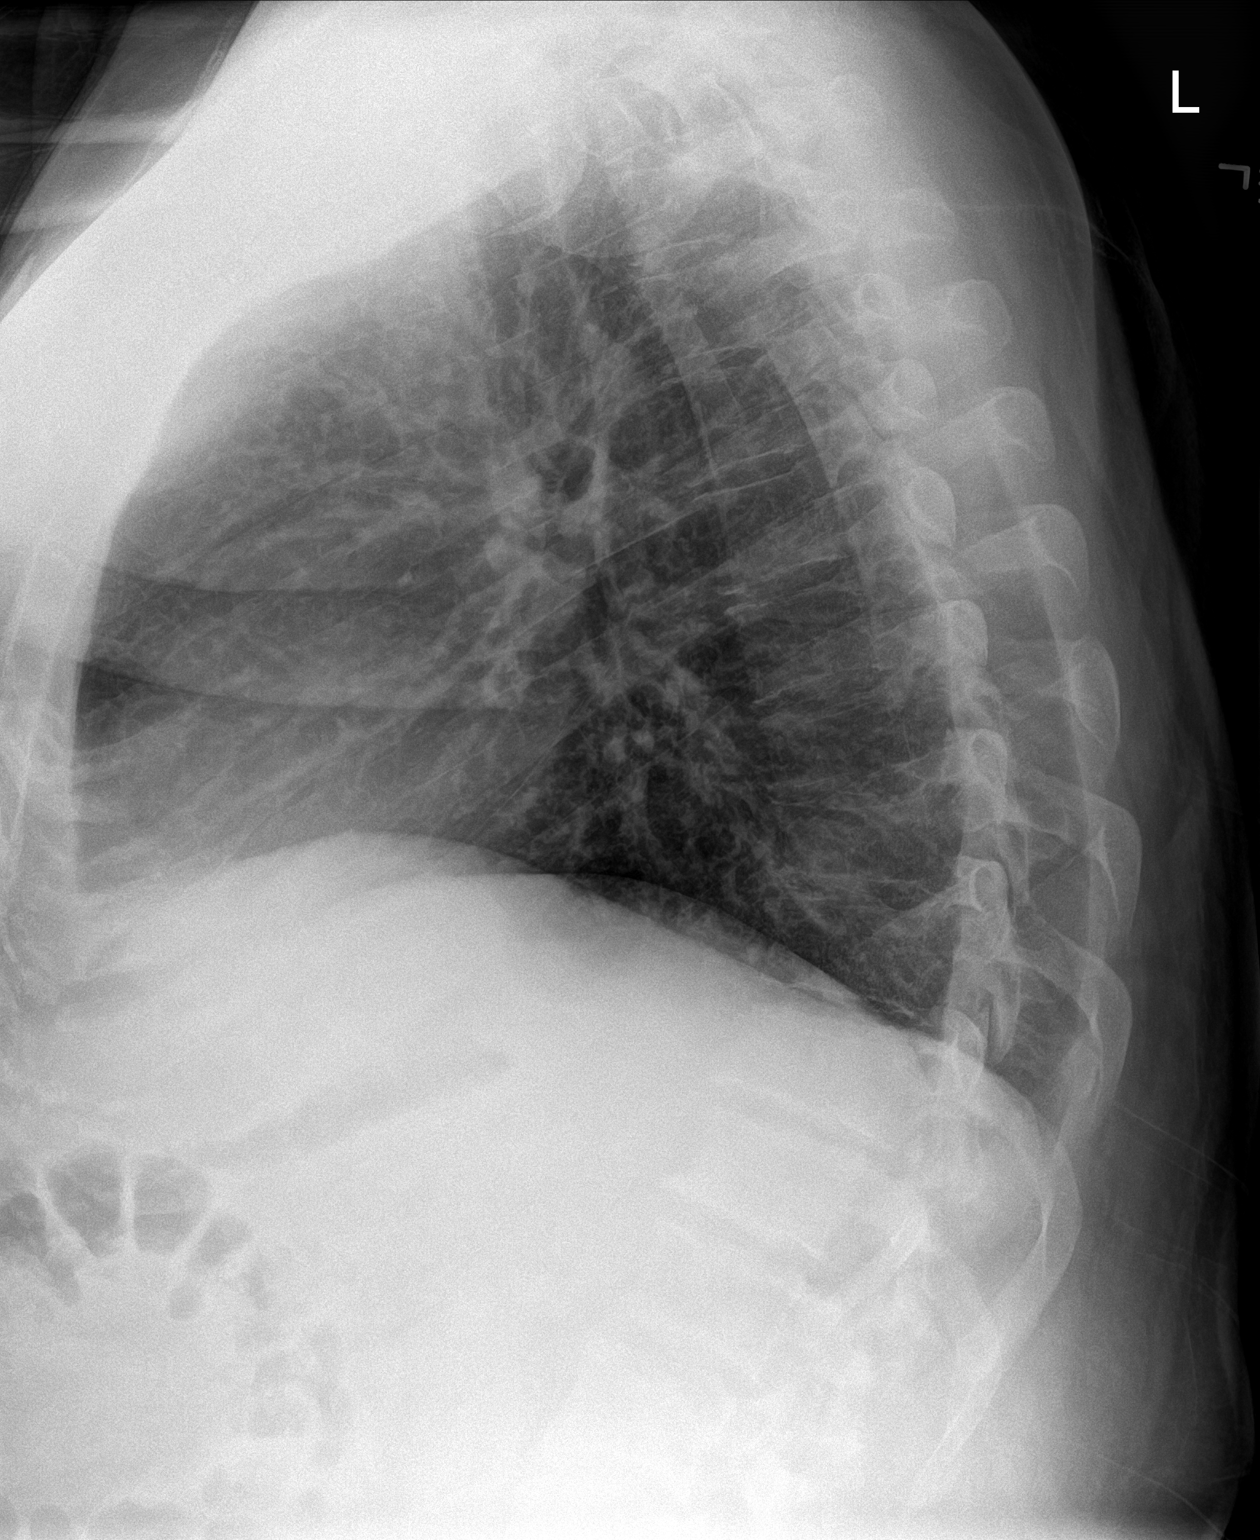

[2 of 2 positions shown; findings below may reference images not displayed]

FINDINGS: Stable chronic lateral soft tissue density over the right hemithorax
with some linear calcification, suspect chronic pleural thickening
as before. Normal heart size and vascularity. No superimposed acute
chest process, collapse or consolidation. No edema, effusion or
pneumothorax. Trachea midline. No acute osseous finding.
IMPRESSION: Stable chest exam.  No acute finding by plain radiography
